# Patient Record
Sex: Female | Born: 2000 | Race: White | Hispanic: No | Marital: Single | Smoking: Never smoker
Health system: Southern US, Community
[De-identification: ages and names within clinical notes are randomized; demographics above are authoritative.]

---
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
## 2019-11-10 IMAGING — CR DG HAND COMPLETE 3+V*L*
3 series · 3 of 3 positions shown · non-contrast
Comparison: None.

CLINICAL DATA: Injury to hand, pain at 3rd digit

EXAM:
LEFT HAND - COMPLETE 3+ VIEW

[x hand pa left]
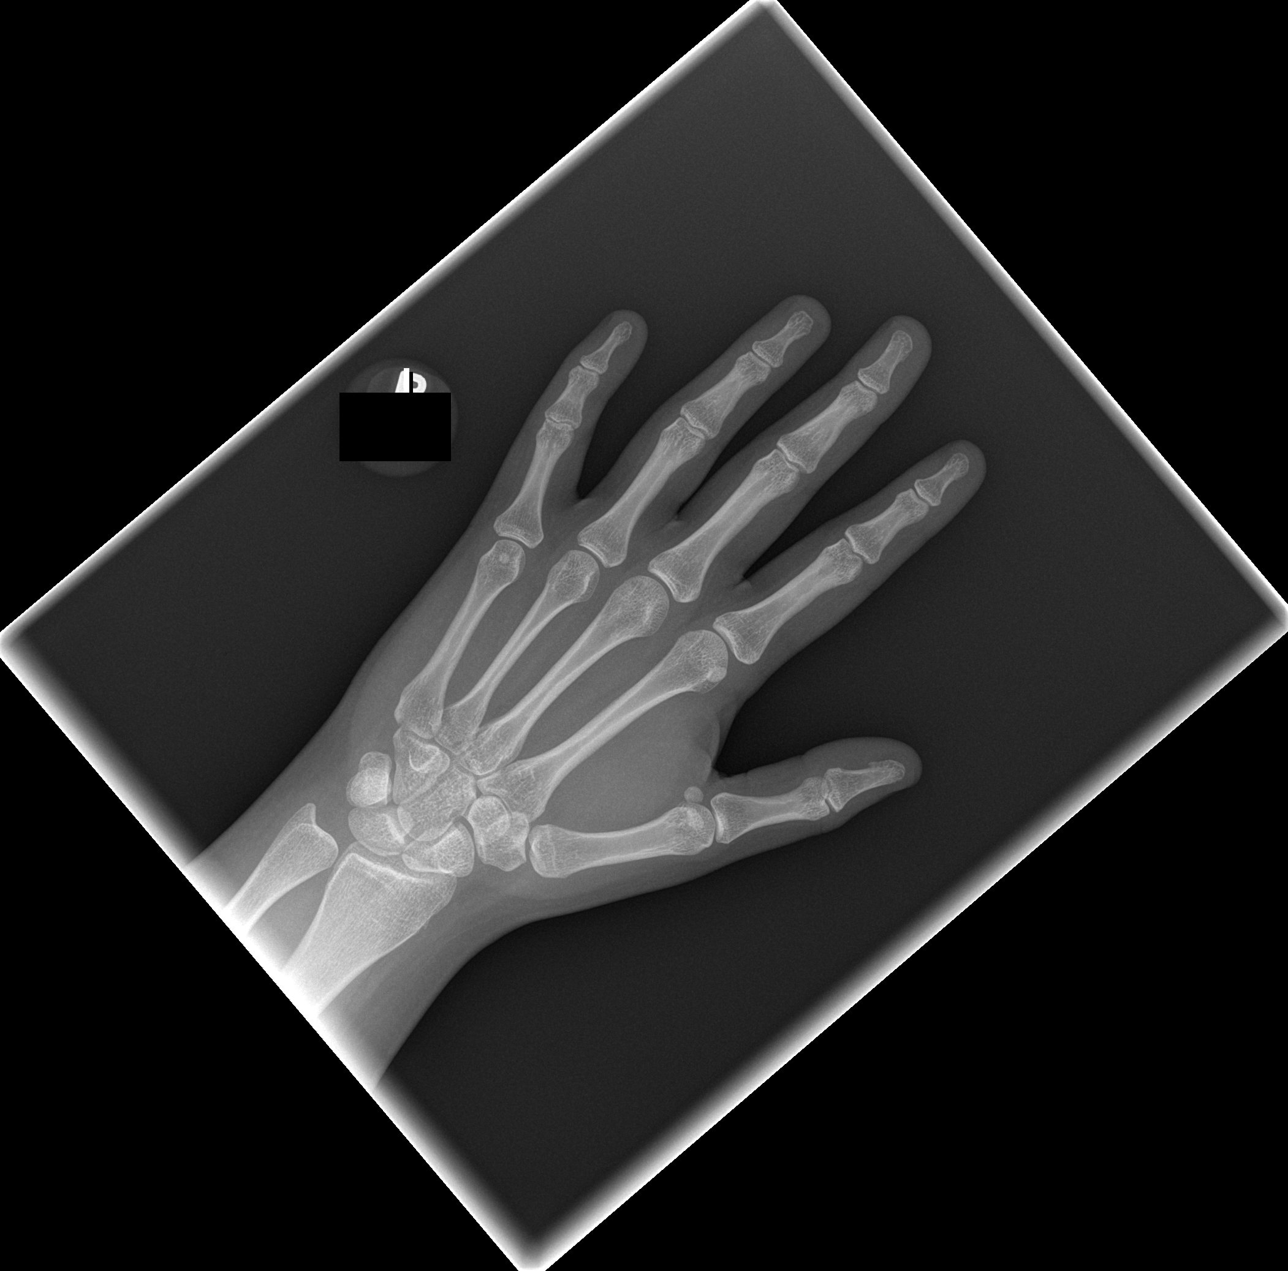

[x hand oblique left]
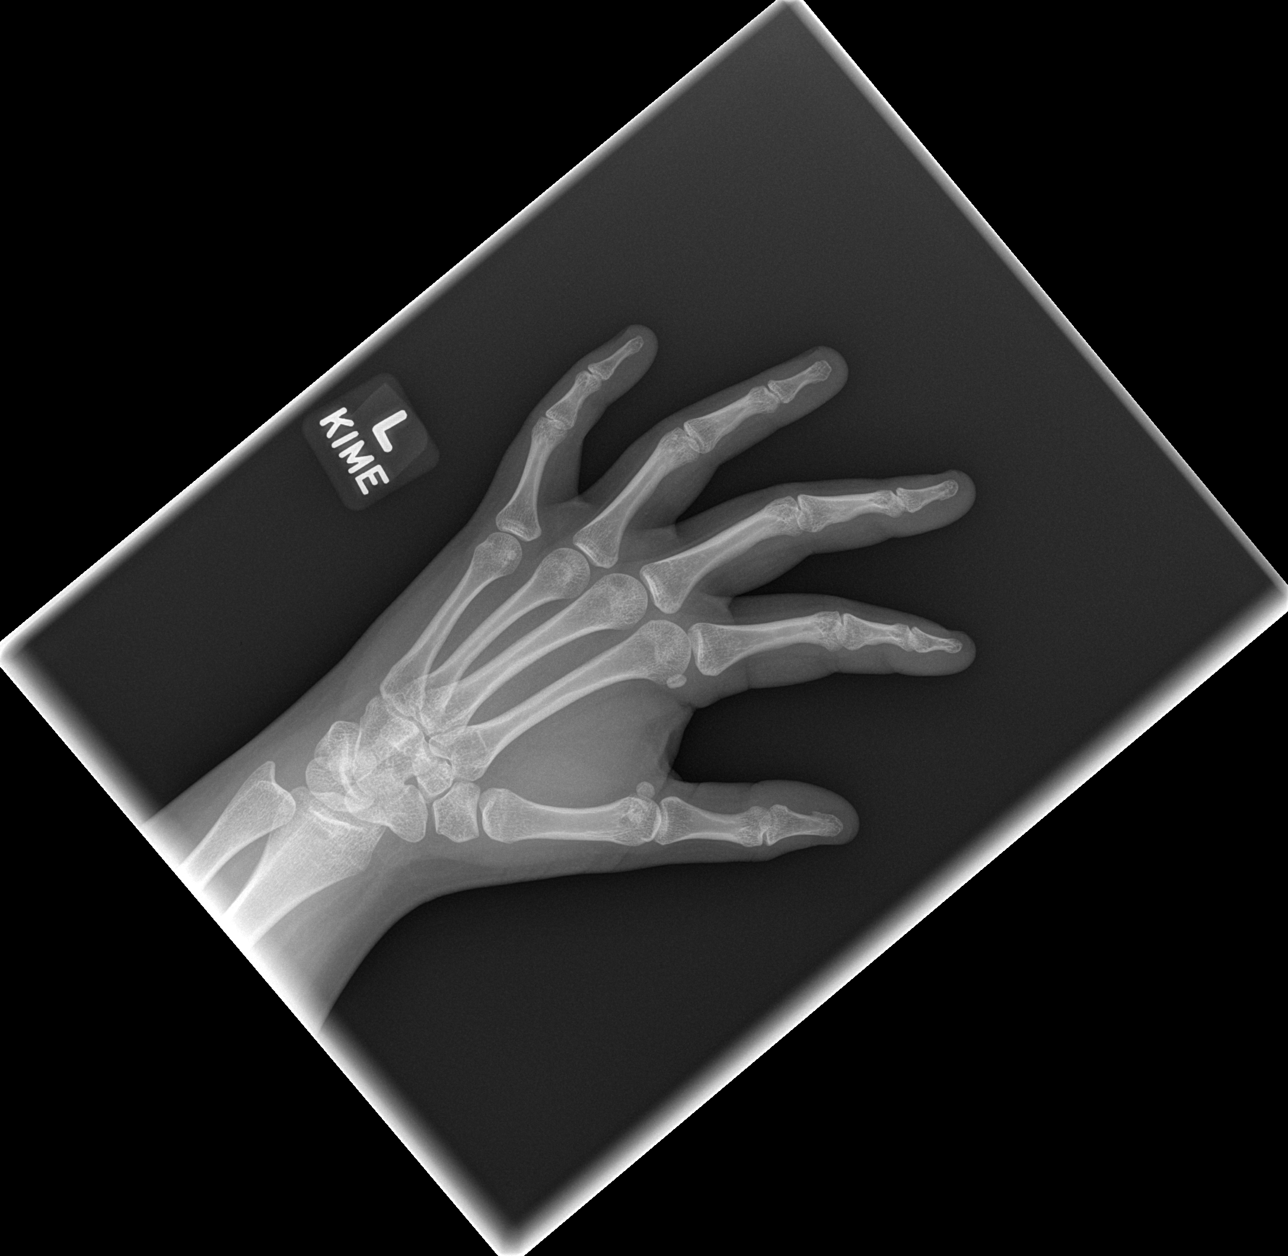

[x hand lat left]
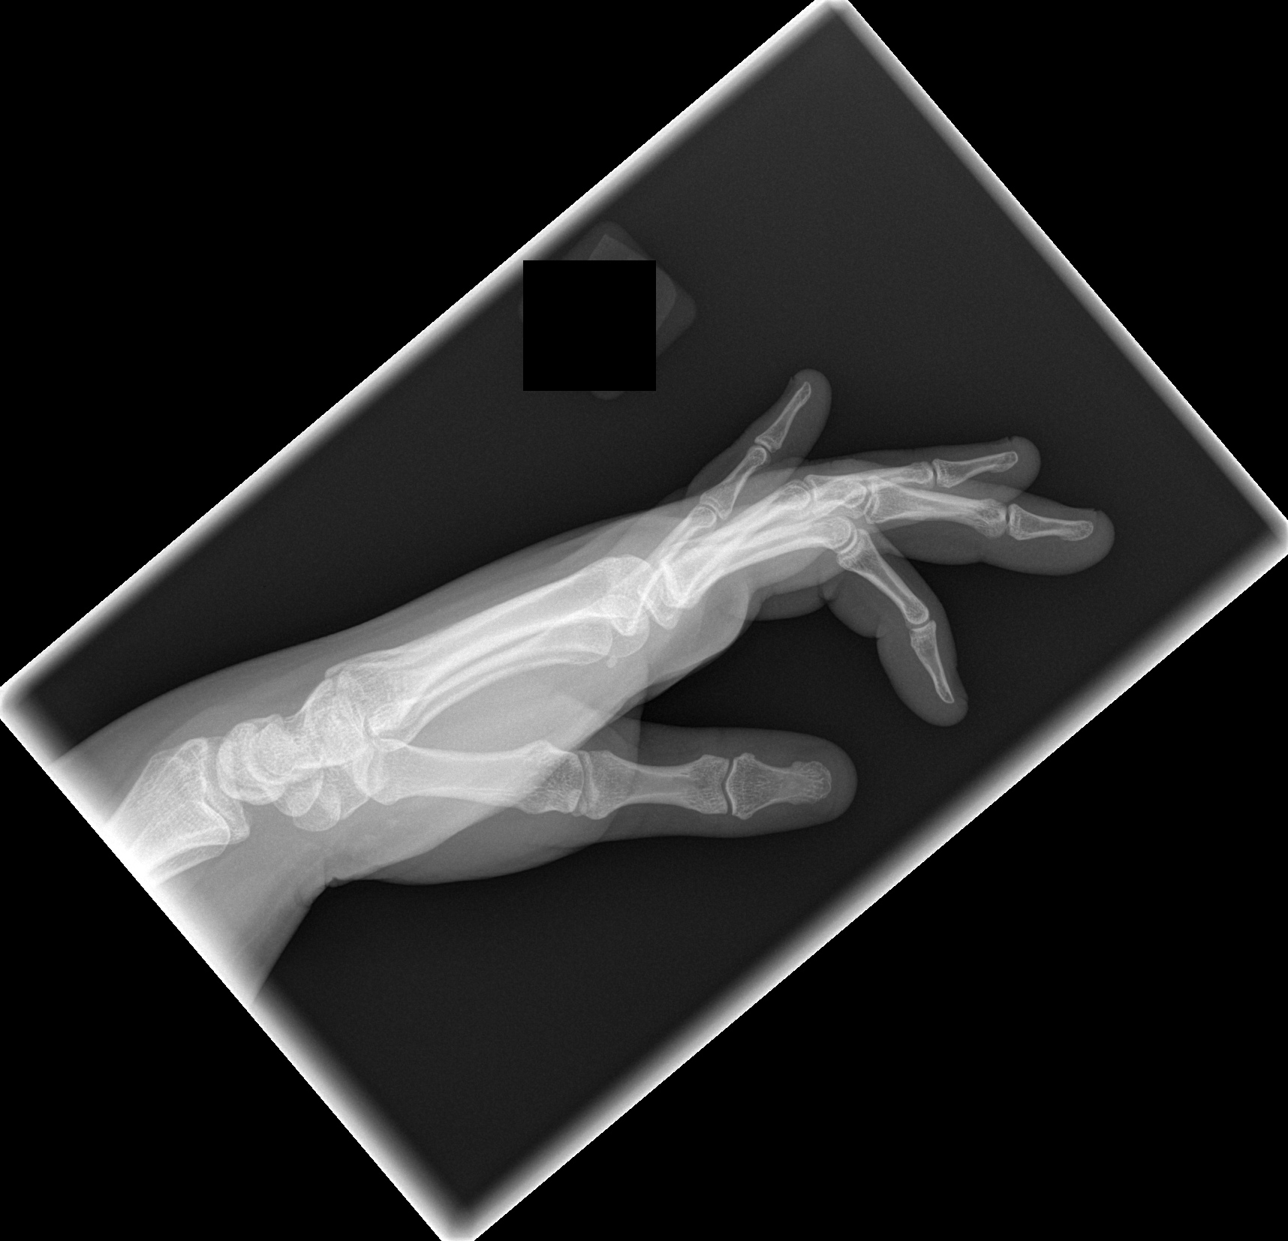

[3 of 3 positions shown; findings below may reference images not displayed]

FINDINGS: No fracture or dislocation is seen.

The joint spaces are preserved.

Visualized soft tissues are within normal limits.
IMPRESSION: Negative.
